# Patient Record
Sex: Male | Born: 1965 | Race: White | Hispanic: No | Smoking: Never smoker
Health system: Southern US, Community
[De-identification: ages and names within clinical notes are randomized; demographics above are authoritative.]

## PROBLEM LIST (undated history)

## (undated) DIAGNOSIS — I219 Acute myocardial infarction, unspecified: Secondary | ICD-10-CM

## (undated) HISTORY — PX: WRIST SURGERY: SHX841

---

## 1998-04-07 ENCOUNTER — Emergency Department (HOSPITAL_COMMUNITY): Admission: EM | Admit: 1998-04-07 | Discharge: 1998-04-07 | Payer: Self-pay | Admitting: Emergency Medicine

## 1998-04-07 ENCOUNTER — Emergency Department (HOSPITAL_COMMUNITY): Admission: EM | Admit: 1998-04-07 | Discharge: 1998-04-07 | Payer: Self-pay

## 1998-04-07 ENCOUNTER — Encounter: Payer: Self-pay | Admitting: Emergency Medicine

## 2000-08-09 ENCOUNTER — Emergency Department (HOSPITAL_COMMUNITY): Admission: EM | Admit: 2000-08-09 | Discharge: 2000-08-09 | Payer: Self-pay | Admitting: Emergency Medicine

## 2000-08-09 ENCOUNTER — Encounter: Payer: Self-pay | Admitting: Internal Medicine

## 2000-08-28 ENCOUNTER — Encounter: Admission: RE | Admit: 2000-08-28 | Discharge: 2000-08-28 | Payer: Self-pay | Admitting: Orthopedic Surgery

## 2000-08-28 ENCOUNTER — Encounter: Payer: Self-pay | Admitting: Orthopedic Surgery

## 2001-01-12 ENCOUNTER — Emergency Department (HOSPITAL_COMMUNITY): Admission: AC | Admit: 2001-01-12 | Discharge: 2001-01-12 | Payer: Self-pay

## 2001-04-16 ENCOUNTER — Encounter: Payer: Self-pay | Admitting: Physical Medicine and Rehabilitation

## 2001-04-16 ENCOUNTER — Ambulatory Visit (HOSPITAL_COMMUNITY)
Admission: RE | Admit: 2001-04-16 | Discharge: 2001-04-16 | Payer: Self-pay | Admitting: Physical Medicine and Rehabilitation

## 2002-02-09 ENCOUNTER — Encounter: Payer: Self-pay | Admitting: *Deleted

## 2002-02-09 ENCOUNTER — Emergency Department (HOSPITAL_COMMUNITY): Admission: AC | Admit: 2002-02-09 | Discharge: 2002-02-09 | Payer: Self-pay

## 2002-05-22 ENCOUNTER — Emergency Department (HOSPITAL_COMMUNITY): Admission: EM | Admit: 2002-05-22 | Discharge: 2002-05-23 | Payer: Self-pay | Admitting: Emergency Medicine

## 2004-10-04 ENCOUNTER — Emergency Department (HOSPITAL_COMMUNITY): Admission: EM | Admit: 2004-10-04 | Discharge: 2004-10-04 | Payer: Self-pay | Admitting: Emergency Medicine

## 2005-08-07 ENCOUNTER — Emergency Department (HOSPITAL_COMMUNITY): Admission: EM | Admit: 2005-08-07 | Discharge: 2005-08-08 | Payer: Self-pay | Admitting: Emergency Medicine

## 2006-06-18 ENCOUNTER — Inpatient Hospital Stay (HOSPITAL_COMMUNITY): Admission: EM | Admit: 2006-06-18 | Discharge: 2006-06-23 | Payer: Self-pay | Admitting: Emergency Medicine

## 2006-06-25 ENCOUNTER — Emergency Department (HOSPITAL_COMMUNITY): Admission: EM | Admit: 2006-06-25 | Discharge: 2006-06-25 | Payer: Self-pay | Admitting: Emergency Medicine

## 2017-03-27 ENCOUNTER — Emergency Department (HOSPITAL_COMMUNITY): Payer: Self-pay

## 2017-03-27 ENCOUNTER — Other Ambulatory Visit: Payer: Self-pay

## 2017-03-27 ENCOUNTER — Encounter (HOSPITAL_COMMUNITY): Payer: Self-pay

## 2017-03-27 ENCOUNTER — Emergency Department (HOSPITAL_COMMUNITY)
Admission: EM | Admit: 2017-03-27 | Discharge: 2017-03-27 | Disposition: A | Discharge status: Home / Self Care | Payer: Self-pay | Attending: Emergency Medicine | Admitting: Emergency Medicine

## 2017-03-27 DIAGNOSIS — I252 Old myocardial infarction: Secondary | ICD-10-CM | POA: Insufficient documentation

## 2017-03-27 DIAGNOSIS — X509XXA Other and unspecified overexertion or strenuous movements or postures, initial encounter: Secondary | ICD-10-CM | POA: Insufficient documentation

## 2017-03-27 DIAGNOSIS — Z87891 Personal history of nicotine dependence: Secondary | ICD-10-CM | POA: Insufficient documentation

## 2017-03-27 DIAGNOSIS — S46102A Unspecified injury of muscle, fascia and tendon of long head of biceps, left arm, initial encounter: Secondary | ICD-10-CM | POA: Insufficient documentation

## 2017-03-27 DIAGNOSIS — Y9389 Activity, other specified: Secondary | ICD-10-CM | POA: Insufficient documentation

## 2017-03-27 DIAGNOSIS — Y9259 Other trade areas as the place of occurrence of the external cause: Secondary | ICD-10-CM | POA: Insufficient documentation

## 2017-03-27 DIAGNOSIS — Y990 Civilian activity done for income or pay: Secondary | ICD-10-CM | POA: Insufficient documentation

## 2017-03-27 HISTORY — DX: Acute myocardial infarction, unspecified: I21.9

## 2017-03-27 MED ORDER — NAPROXEN 500 MG PO TABS: 500.0000 mg | ORAL_TABLET | Freq: Two times a day (BID) | ORAL | 0 refills | Status: DC

## 2017-03-27 MED ORDER — OXYCODONE-ACETAMINOPHEN 5-325 MG PO TABS
1.0000 | ORAL_TABLET | Freq: Once | ORAL | Status: DC
  Filled 2017-03-27: qty 1

## 2017-03-27 NOTE — Discharge Instructions (Signed)
Call [facility] for a follow up appointment.  °

## 2017-03-27 NOTE — ED Notes (Signed)
Pt refused d/c vitals and states that "I dont want medication" when given his d/c paperwork.

## 2017-03-27 NOTE — ED Notes (Signed)
Pt allowed PA to place sling on arm.

## 2017-03-27 NOTE — ED Notes (Signed)
Pt yelling in the hallway because he does not want the ortho tech to bend his arm. He is cursing and Engineer, sitebelittling staff.

## 2017-03-27 NOTE — ED Provider Notes (Signed)
[facility]-EMERGENCY DEPT Provider Note   CSN: [id number] Arrival date & time: 03/27/17  1759     History   Chief Complaint Chief Complaint  Patient presents with  . Arm Pain    HPI Trevor Stephens is a 51 y.o. male who presents to the ED with arm pain. 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 Then earlier today he started to catch a transmission that was going to fall on his foot. Patient reports that it felt like a muscle ripped in his arm. The pain is in the left shoulder and left bicep muscle.   HPI  Past Medical History:  Diagnosis Date  . MI (myocardial infarction) (HCC)     There are no active problems to display for this patient.   Past Surgical History:  Procedure Laterality Date  . WRIST SURGERY         Home Medications    Prior to Admission medications   Medication Sig Start Date End Date Taking? Authorizing Provider  naproxen (NAPROSYN) 500 MG tablet Take 1 tablet (500 mg total) by mouth 2 (two) times daily. 03/27/17   Janne NapoleonNeese,  M, NP    Family History Family History  Problem Relation Age of Onset  . Cancer Mother   . Heart failure Father   . Hypertension Father     Social History Social History   Tobacco Use  . Smoking status: Never Smoker  . Smokeless tobacco: Former Engineer, waterUser  Substance Use Topics  . Alcohol use: No    Frequency: Never  . Drug use: No     Allergies   Fish allergy and Penicillins   Review of Systems Review of Systems  Musculoskeletal: Positive for arthralgias.       Left arm  All other systems reviewed and are negative.    Physical Exam Updated Vital Signs BP (!) 142/95 (BP Location: Left Arm)   Pulse 84   Temp 98.4 F (36.9 C) (Oral)   Resp 16   Ht 6\' 2"  (1.88 m)   Wt 81.6 kg (180 lb)   SpO2 93%   BMI 23.11 kg/m   Physical Exam  Constitutional: He appears well-developed and well-nourished. No distress.    HENT:  Head: Normocephalic.  Eyes: EOM are normal.  Neck: Neck supple.  Cardiovascular: Normal rate.  Pulmonary/Chest: Effort normal.  Musculoskeletal:       Left upper arm: He exhibits tenderness and swelling.  Left biceps with tenderness on palpation and possible bicep disruption. Radial pulse 2+, adequate circulation. Left shoulder with tenderness on palpation and with attempted range of motion.   Neurological: He is alert.  Skin: Skin is warm and dry.  Nursing note and vitals reviewed.    ED Treatments / Results  Labs (all labs ordered are listed, but only abnormal results are displayed) Labs Reviewed - No data to display  Radiology Dg Shoulder Left  Result Date: 03/27/2017 CLINICAL DATA:  Shoulder pain after catching a falling piece of metal. EXAM: LEFT SHOULDER - 2+ VIEW COMPARISON:  None. FINDINGS: There is no evidence of fracture or dislocation. Joint space narrowing and minimal spurring about the [facility] joint consistent with osteoarthritis. Soft tissues are unremarkable. The adjacent ribs and lung are nonacute. IMPRESSION: Minimal AC joint osteoarthritis.  No acute osseous abnormality. Electronically Signed   By: Tollie Ethavid  Kwon M.D.   On: 03/27/2017 18:58   Dg Humerus Left  Result Date: 03/27/2017  CLINICAL DATA:  Muscle pain of the left arm after catching a falling piece of metal. EXAM: LEFT HUMERUS - 2+ VIEW COMPARISON:  None. FINDINGS: There is no evidence of fracture or other focal bone lesions. AC joint osteoarthritis with joint space narrowing and minimal spurring is noted. Intact glenohumeral joint. Soft tissues are unremarkable. IMPRESSION: AC joint osteoarthritis. No acute osseous abnormality of the left humerus. Electronically Signed   By: Tollie Ethavid  Kwon M.D.   On: 03/27/2017 18:57    Procedures Procedures (including critical care time)  Medications Ordered in ED Medications  oxyCODONE-acetaminophen (PERCOCET/ROXICET) 5-325 MG per tablet 1 tablet (1 tablet Oral Refused  03/27/17 2112)     Initial Impression / Assessment and Plan / ED Course  I have reviewed the triage vital signs and the nursing notes. 51 y.o. male with possible biceps disruption stable for [facility]. Arm sling. Offered pain medication and patient declined. Return precautions discussed.   Final Clinical Impressions(s) / ED Diagnoses   Final diagnoses:  Injury of tendon of long head of left biceps, initial encounter    ED Discharge Orders        Ordered    naproxen (NAPROSYN) 500 MG tablet  2 times daily     03/27/17 2132       Kerrie Buffaloeese,  [place], [place] 03/27/17 2233    Arby BarrettePfeiffer, Marcy, MD 03/31/17 1751

## 2017-03-27 NOTE — ED Triage Notes (Signed)
Patient  States he went to catch a heavy piece of metal that was going to fall on his foot and he felt "like the muscle pulled" in his left arm.

## 2017-03-27 NOTE — ED Notes (Signed)
Pt refusing sling. He states "No one is bending my arm."

## 2017-10-13 ENCOUNTER — Emergency Department (HOSPITAL_COMMUNITY): Payer: Self-pay

## 2017-10-13 ENCOUNTER — Encounter (HOSPITAL_COMMUNITY): Payer: Self-pay

## 2017-10-13 ENCOUNTER — Other Ambulatory Visit: Payer: Self-pay

## 2017-10-13 ENCOUNTER — Emergency Department (HOSPITAL_COMMUNITY)
Admission: EM | Admit: 2017-10-13 | Discharge: 2017-10-13 | Disposition: A | Discharge status: Home / Self Care | Payer: Self-pay | Attending: Emergency Medicine | Admitting: Emergency Medicine

## 2017-10-13 DIAGNOSIS — W57XXXA Bitten or stung by nonvenomous insect and other nonvenomous arthropods, initial encounter: Secondary | ICD-10-CM | POA: Insufficient documentation

## 2017-10-13 DIAGNOSIS — Y939 Activity, unspecified: Secondary | ICD-10-CM | POA: Insufficient documentation

## 2017-10-13 DIAGNOSIS — S40861A Insect bite (nonvenomous) of right upper arm, initial encounter: Secondary | ICD-10-CM | POA: Insufficient documentation

## 2017-10-13 DIAGNOSIS — M79641 Pain in right hand: Secondary | ICD-10-CM | POA: Insufficient documentation

## 2017-10-13 DIAGNOSIS — Y999 Unspecified external cause status: Secondary | ICD-10-CM | POA: Insufficient documentation

## 2017-10-13 DIAGNOSIS — S60221A Contusion of right hand, initial encounter: Secondary | ICD-10-CM | POA: Insufficient documentation

## 2017-10-13 DIAGNOSIS — G8921 Chronic pain due to trauma: Secondary | ICD-10-CM | POA: Insufficient documentation

## 2017-10-13 DIAGNOSIS — W231XXA Caught, crushed, jammed, or pinched between stationary objects, initial encounter: Secondary | ICD-10-CM | POA: Insufficient documentation

## 2017-10-13 DIAGNOSIS — Y929 Unspecified place or not applicable: Secondary | ICD-10-CM | POA: Insufficient documentation

## 2017-10-13 DIAGNOSIS — M25531 Pain in right wrist: Secondary | ICD-10-CM

## 2017-10-13 MED ORDER — SULFAMETHOXAZOLE-TRIMETHOPRIM 800-160 MG PO TABS: 1.0000 | ORAL_TABLET | Freq: Two times a day (BID) | ORAL | 0 refills | Status: AC

## 2017-10-13 MED ORDER — NAPROXEN 500 MG PO TABS: 500.0000 mg | ORAL_TABLET | Freq: Two times a day (BID) | ORAL | 0 refills | Status: AC

## 2017-10-13 NOTE — ED Triage Notes (Addendum)
Patient reports that he has an insect bite on the right upper arm near the elbow x 2  Days ago.  Patient states he got his right hand smashed between 2 concrete/metal forms approx 3 weeks ago. patient states that his right hand is stiff and at times has pins and needles feeling in it.

## 2017-10-13 NOTE — ED Provider Notes (Signed)
[facility]-EMERGENCY DEPT Provider Note   CSN: [id number] Arrival date & time: 10/13/17  1659     History   Chief Complaint Chief Complaint  Patient presents with  . Insect Bite  . hand issues    HPI Trevor Stephens is a 52 y.o. male who presents to the ED with c/o insect bite to the right upper arm that occurred 2 days ago. Patient also reports that he got his hand smashed between concrete and metal 3 weeks ago and continues to have pain. Patient also reports hx of repetitive use of hand on his job and that his hands and wrist have been hurting for a good while now.    HPI  Past Medical History:  Diagnosis Date  . MI (myocardial infarction) (HCC)     There are no active problems to display for this patient.   Past Surgical History:  Procedure Laterality Date  . WRIST SURGERY          Home Medications    Prior to Admission medications   Medication Sig Start Date End Date Taking? Authorizing Provider  naproxen (NAPROSYN) 500 MG tablet Take 1 tablet (500 mg total) by mouth 2 (two) times daily. 10/13/17   Janne NapoleonNeese, Hope M, NP  sulfamethoxazole-trimethoprim (BACTRIM DS,SEPTRA DS) 800-160 MG tablet Take 1 tablet by mouth 2 (two) times daily for 7 days. 10/13/17 10/20/17  Janne NapoleonNeese, Hope M, NP    Family History Family History  Problem Relation Age of Onset  . Cancer Mother   . Heart failure Father   . Hypertension Father     Social History Social History   Tobacco Use  . Smoking status: Never Smoker  . Smokeless tobacco: Former Engineer, waterUser  Substance Use Topics  . Alcohol use: No    Frequency: Never  . Drug use: No     Allergies   Fish allergy and Penicillins   Review of Systems Review of Systems  Musculoskeletal: Positive for arthralgias.  Skin: Positive for wound.  All other systems reviewed and are negative.    Physical Exam Updated Vital Signs BP 115/86 (BP Location: Left Arm)   Pulse (!) 50   Temp 97.8 F (36.6 C) (Oral)   Resp  16   Ht 6\' 2"  (1.88 m)   Wt 74.8 kg (165 lb)   SpO2 100%   BMI 21.18 kg/m   Physical Exam  Constitutional: He appears well-developed and well-nourished. No distress.  HENT:  Head: Normocephalic and atraumatic.  Eyes: EOM are normal.  Neck: Normal range of motion. Neck supple.  Cardiovascular: Bradycardia present.  Pulmonary/Chest: Effort normal.  Musculoskeletal: Normal range of motion.       Right elbow: He exhibits swelling. Tenderness found.       Right wrist: He exhibits tenderness. He exhibits normal range of motion, no deformity and no laceration.  Positive Tinel's sign right wrist. Radial pulse 2+, equal grips. Right upper arm just above the elbow with 1 cm area of erythema, tender on exam with pustular center. C/w infected insect bite.   Neurological: He is alert.  Skin: Skin is warm and dry.  Psychiatric: He has a normal mood and affect.  Nursing note and vitals reviewed.    ED Treatments / Results  Labs (all labs ordered are listed, but only abnormal results are displayed) Labs Reviewed - No data to display  Radiology Dg Hand Complete Right  Result Date: 10/13/2017 CLINICAL DATA:  Smash injury.  Three weeks ago.  Pain. EXAM:  RIGHT HAND - COMPLETE 3+ VIEW COMPARISON:  None. FINDINGS: There is no evidence of fracture or dislocation. There is no evidence of arthropathy or other focal bone abnormality. Soft tissues are unremarkable. IMPRESSION: Negative. Electronically Signed   By: Elsie Stain M.D.   On: 10/13/2017 18:59    Procedures Procedures (including critical care time)  Medications Ordered in ED Medications - No data to display   Initial Impression / Assessment and Plan / ED Course  I have reviewed the triage vital signs and the nursing notes. 52 y.o. male with wrist pain and infected insect bite stable for d/c without fever and does not appear toxic. Wrist splint to right wrist and referral to hand for further evaluation. Wound treated with antibiotics.  Return precautions discussed.   Final Clinical Impressions(s) / ED Diagnoses   Final diagnoses:  Bug bite with infection, initial encounter  Right wrist pain  Contusion of right hand, initial encounter    ED Discharge Orders        Ordered    sulfamethoxazole-trimethoprim (BACTRIM DS,SEPTRA DS) 800-160 MG tablet  2 times daily     10/13/17 1918    naproxen (NAPROSYN) 500 MG tablet  2 times daily     10/13/17 1918       Kerrie Buffalo [place], [place] 10/13/17 2158    Benjiman Core, MD 10/14/17 [phone]

## 2017-10-13 NOTE — Discharge Instructions (Addendum)
Wear the wrist splint for support and comfort. Call Dr. Carlos LeveringGramig's office for a follow up appointment for your wrist and hand pain. Return here as needed.

## 2017-10-13 NOTE — ED Notes (Signed)
Ortho at bedside.

## 2017-10-16 ENCOUNTER — Emergency Department (HOSPITAL_COMMUNITY)
Admission: EM | Admit: 2017-10-16 | Discharge: 2017-10-17 | Disposition: A | Discharge status: Home / Self Care | Payer: Self-pay | Attending: Emergency Medicine | Admitting: Emergency Medicine

## 2017-10-16 ENCOUNTER — Encounter (HOSPITAL_COMMUNITY): Payer: Self-pay

## 2017-10-16 ENCOUNTER — Other Ambulatory Visit: Payer: Self-pay

## 2017-10-16 DIAGNOSIS — Z79899 Other long term (current) drug therapy: Secondary | ICD-10-CM | POA: Insufficient documentation

## 2017-10-16 DIAGNOSIS — I252 Old myocardial infarction: Secondary | ICD-10-CM | POA: Insufficient documentation

## 2017-10-16 DIAGNOSIS — Y929 Unspecified place or not applicable: Secondary | ICD-10-CM | POA: Insufficient documentation

## 2017-10-16 DIAGNOSIS — Y999 Unspecified external cause status: Secondary | ICD-10-CM | POA: Insufficient documentation

## 2017-10-16 DIAGNOSIS — Z87891 Personal history of nicotine dependence: Secondary | ICD-10-CM | POA: Insufficient documentation

## 2017-10-16 DIAGNOSIS — Y939 Activity, unspecified: Secondary | ICD-10-CM | POA: Insufficient documentation

## 2017-10-16 DIAGNOSIS — S40861A Insect bite (nonvenomous) of right upper arm, initial encounter: Secondary | ICD-10-CM | POA: Insufficient documentation

## 2017-10-16 DIAGNOSIS — W57XXXA Bitten or stung by nonvenomous insect and other nonvenomous arthropods, initial encounter: Secondary | ICD-10-CM | POA: Insufficient documentation

## 2017-10-16 LAB — CBC WITH DIFFERENTIAL/PLATELET
Basophils Absolute: 0 10*3/uL (ref 0.0–0.1)
Basophils Relative: 0 %
Eosinophils Absolute: 0.1 10*3/uL (ref 0.0–0.7)
Eosinophils Relative: 1 %
HCT: 39.6 % (ref 39.0–52.0)
Hemoglobin: 13.2 g/dL (ref 13.0–17.0)
Lymphocytes Relative: 16 %
Lymphs Abs: 1.9 10*3/uL (ref 0.7–4.0)
MCH: 30.1 pg (ref 26.0–34.0)
MCHC: 33.3 g/dL (ref 30.0–36.0)
MCV: 90.4 fL (ref 78.0–100.0)
Monocytes Absolute: 0.9 10*3/uL (ref 0.1–1.0)
Monocytes Relative: 8 %
Neutro Abs: 8.4 10*3/uL — ABNORMAL HIGH (ref 1.7–7.7)
Neutrophils Relative %: 75 %
Platelets: 309 10*3/uL (ref 150–400)
RBC: 4.38 MIL/uL (ref 4.22–5.81)
RDW: 13.2 % (ref 11.5–15.5)
WBC: 11.3 10*3/uL — ABNORMAL HIGH (ref 4.0–10.5)

## 2017-10-16 LAB — COMPREHENSIVE METABOLIC PANEL
ALK PHOS: 92 U/L (ref 38–126)
ALT: 14 U/L (ref 0–44)
AST: 18 U/L (ref 15–41)
Albumin: 3.7 g/dL (ref 3.5–5.0)
Anion gap: 7 (ref 5–15)
BUN: 19 mg/dL (ref 6–20)
CALCIUM: 9.1 mg/dL (ref 8.9–10.3)
CO2: 26 mmol/L (ref 22–32)
CREATININE: 1.15 mg/dL (ref 0.61–1.24)
Chloride: 105 mmol/L (ref 98–111)
Glucose, Bld: 123 mg/dL — ABNORMAL HIGH (ref 70–99)
Potassium: 3.7 mmol/L (ref 3.5–5.1)
Sodium: 138 mmol/L (ref 135–145)
Total Bilirubin: 0.5 mg/dL (ref 0.3–1.2)
Total Protein: 6.8 g/dL (ref 6.5–8.1)

## 2017-10-16 LAB — I-STAT CG4 LACTIC ACID, ED: Lactic Acid, Venous: 1.47 mmol/L (ref 0.5–1.9)

## 2017-10-16 MED ORDER — CLINDAMYCIN PHOSPHATE 600 MG/50ML IV SOLN
600.0000 mg | Freq: Once | INTRAVENOUS | Status: AC
  Administered 2017-10-16: 600 mg via INTRAVENOUS
  Filled 2017-10-16: qty 50

## 2017-10-16 MED ORDER — OXYCODONE-ACETAMINOPHEN 5-325 MG PO TABS
1.0000 | ORAL_TABLET | Freq: Once | ORAL | Status: AC
  Administered 2017-10-16: 1 via ORAL
  Filled 2017-10-16: qty 1

## 2017-10-16 NOTE — ED Notes (Signed)
Patient educated about not driving or performing other critical tasks (such as operating heavy machinery, caring for infant/toddler/child) due to sedative nature of narcotic medications received while in the ED.  Pt/caregiver verbalized understanding.   

## 2017-10-16 NOTE — ED Notes (Signed)
ED Provider at bedside. 

## 2017-10-16 NOTE — ED Triage Notes (Signed)
Pt has a spider bite from about three days ago and was seen and treated with oral antibiotics Pt states it's getting worse and he can't bend his elbow and his arm is red and swollen

## 2017-10-16 NOTE — ED Provider Notes (Signed)
[facility]-EMERGENCY DEPT Provider Note   CSN: [id number] Arrival date & time: 10/16/17  2011     History   Chief Complaint Chief Complaint  Patient presents with  . Insect Bite    HPI Trevor Stephens is a 52 y.o. male with a past medical history of prior MI, who presents to ED for evaluation of insect bite to the right upper arm that occurred 5 days ago.  States that he was outside in the dark in a porta potty when he got bit by an unknown insect on the arm.  He was seen and evaluated here in the ED 3 days ago and was given a prescription for Bactrim.  He states that he was unable to get the prescription filled due to not being able to afford it.  He has not been taking any medicine to help with the area.  States that there was some drainage from the area earlier.  Denies any fever, additional trauma to the area.  HPI  Past Medical History:  Diagnosis Date  . MI (myocardial infarction) (HCC)     There are no active problems to display for this patient.   Past Surgical History:  Procedure Laterality Date  . WRIST SURGERY          Home Medications    Prior to Admission medications   Medication Sig Start Date End Date Taking? Authorizing Provider  clindamycin (CLEOCIN) 150 MG capsule Take 3 capsules (450 mg total) by mouth 3 (three) times daily for 5 days. 10/17/17 10/22/17  Lathyn Griggs, PA-C  naproxen (NAPROSYN) 500 MG tablet Take 1 tablet (500 mg total) by mouth 2 (two) times daily. Patient not taking: Reported on 10/16/2017 10/13/17   Janne Napoleon, NP  sulfamethoxazole-trimethoprim (BACTRIM DS,SEPTRA DS) 800-160 MG tablet Take 1 tablet by mouth 2 (two) times daily for 7 days. Patient not taking: Reported on 10/16/2017 10/13/17 10/20/17  Janne Napoleon, NP  traMADol (ULTRAM) 50 MG tablet Take 1 tablet (50 mg total) by mouth every 6 (six) hours as needed. 10/17/17   Dietrich Pates, PA-C    Family History Family History  Problem Relation Age of Onset  .  Cancer Mother   . Heart failure Father   . Hypertension Father     Social History Social History   Tobacco Use  . Smoking status: Never Smoker  . Smokeless tobacco: Former Engineer, water Use Topics  . Alcohol use: No    Frequency: Never  . Drug use: No     Allergies   Penicillins and Fish allergy   Review of Systems Review of Systems  Constitutional: Negative for appetite change, chills and fever.  HENT: Negative for ear pain, rhinorrhea, sneezing and sore throat.   Eyes: Negative for photophobia and visual disturbance.  Respiratory: Negative for cough, chest tightness, shortness of breath and wheezing.   Cardiovascular: Negative for chest pain and palpitations.  Gastrointestinal: Negative for abdominal pain, blood in stool, constipation, diarrhea, nausea and vomiting.  Genitourinary: Negative for dysuria, hematuria and urgency.  Musculoskeletal: Negative for myalgias.  Skin: Positive for wound. Negative for rash.  Neurological: Negative for dizziness, weakness and light-headedness.     Physical Exam Updated Vital Signs BP 105/73   Pulse (!) 52   Temp 98.5 F (36.9 C) (Oral)   Resp 17   Ht 6\' 2"  (1.88 m)   Wt 77.1 kg (170 lb)   SpO2 97%   BMI 21.83 kg/m   Physical Exam  Constitutional: He appears well-developed and well-nourished. No distress.  HENT:  Head: Normocephalic and atraumatic.  Nose: Nose normal.  Eyes: Conjunctivae and EOM are normal. Left eye exhibits no discharge. No scleral icterus.  Neck: Normal range of motion. Neck supple.  Cardiovascular: Normal rate, regular rhythm, normal heart sounds and intact distal pulses. Exam reveals no gallop and no friction rub.  No murmur heard. Pulmonary/Chest: Effort normal and breath sounds normal. No respiratory distress.  Abdominal: Soft. Bowel sounds are normal. He exhibits no distension. There is no tenderness. There is no guarding.  Musculoskeletal: Normal range of motion. He exhibits no edema.    Neurological: He is alert. He exhibits normal muscle tone. Coordination normal.  Skin: Skin is warm and dry. No rash noted. There is erythema.  Erythema of the right upper arm as noted in the image.  No fluctuance noted.  No overlying warmth noted.  Area is neurovascularly intact.  Psychiatric: He has a normal mood and affect.  Nursing note and vitals reviewed.      ED Treatments / Results  Labs (all labs ordered are listed, but only abnormal results are displayed) Labs Reviewed  COMPREHENSIVE METABOLIC PANEL - Abnormal; Notable for the following components:      Result Value   Glucose, Bld 123 (*)    All other components within normal limits  CBC WITH DIFFERENTIAL/PLATELET - Abnormal; Notable for the following components:   WBC 11.3 (*)    Neutro Abs 8.4 (*)    All other components within normal limits  I-STAT CG4 LACTIC ACID, ED    EKG None  Radiology No results found.   EMERGENCY DEPARTMENT US SOFT TISSUE INTERPRETATION "Study: Limited Soft Tissue Ultrasound"  INDICATIONS: Pain Multiple views of the body part were obtained in real-time with a multi-frequency linear probe  PERFORMED BY: Myself IMAGES ARCHIVED?: No SIDE:Right  BODY PART:Upper extremity INTERPRETATION:  No abcess noted and Cellulitis present     Procedures Procedures (including critical care time)  Medications Ordered in ED Medications  oxyCODONE-acetaminophen (PERCOCET/ROXICET) 5-325 MG per tablet 1 tablet (1 tablet Oral Given 10/16/17 2242)  clindamycin (CLEOCIN) IVPB 600 mg (0 mg Intravenous Stopped 10/17/17 0016)  morphine 2 MG/ML injection 2 mg (2 mg Intravenous Given 10/17/17 0129)     Initial Impression / Assessment and Plan / ED Course  I have reviewed the triage vital signs and the nursing notes.  Pertinent labs & imaging results that were available during my care of the patient were reviewed by me and considered in my medical decision making (see chart for details).      52 year old male presents to ED for evaluation of insect bite to right upper extremity that occurred 5 days ago.  Seen and evaluated here 2 days ago and antibiotics were prescribed.  He did not get this filled due to price.  States that the area is getting more painful and swollen.  Physical exam findings noted in the image.  No overlying warmth noted.  Ultrasound revealed no abscess or fluid collection.  No fluctuance palpated.  Patient unable to tolerate potential incision and drainage even with pain medication given.  Will give dose of IV clindamycin here and clindamycin p.o. for discharge.  At this point he has not tried any antibiotics so I do not feel that he needs admission for IV antibiotics at this time.  Lab work shows mild leukocytosis at 11.  He is afebrile.  Lactic acid normal.  I urged the patient the importance of taking  his antibiotics as prescribed.  Will give short course of pain medication as well.  Advised to return to ED for any severe worsening symptoms. [place] PMP reviewed with no discrepancies.  Portions of this note were generated with Scientist, clinical (histocompatibility and immunogenetics)Dragon dictation software. Dictation errors may occur despite best attempts at proofreading.   Final Clinical Impressions(s) / ED Diagnoses   Final diagnoses:  Insect bite of right upper arm, initial encounter    ED Discharge Orders        Ordered    clindamycin (CLEOCIN) 150 MG capsule  3 times daily     10/17/17 0149    traMADol (ULTRAM) 50 MG tablet  Every 6 hours PRN     10/17/17 0149       Dietrich PatesKhatri, [place], PA-C 10/17/17 0149    Bethann BerkshireZammit, Joseph, MD 10/18/17 [phone]

## 2017-10-17 MED ORDER — TRAMADOL HCL 50 MG PO TABS: 50.0000 mg | ORAL_TABLET | Freq: Four times a day (QID) | ORAL | 0 refills | Status: AC | PRN

## 2017-10-17 MED ORDER — CLINDAMYCIN HCL 150 MG PO CAPS: 450.0000 mg | ORAL_CAPSULE | Freq: Three times a day (TID) | ORAL | 0 refills | Status: AC

## 2017-10-17 MED ORDER — MORPHINE SULFATE (PF) 2 MG/ML IV SOLN
2.0000 mg | Freq: Once | INTRAVENOUS | Status: AC
  Administered 2017-10-17: 2 mg via INTRAVENOUS
  Filled 2017-10-17: qty 1

## 2017-10-17 NOTE — ED Notes (Signed)
Patient educated about not driving or performing other critical tasks (such as operating heavy machinery, caring for infant/toddler/child) due to sedative nature of narcotic medications received while in the ED.  Pt/caregiver verbalized understanding.   

## 2017-10-17 NOTE — ED Notes (Signed)
ED Provider at bedside. 

## 2017-10-17 NOTE — Discharge Instructions (Addendum)
Return to ED for worsening symptoms, swelling of your joints, fever, increased drainage from area.

## 2017-12-16 ENCOUNTER — Other Ambulatory Visit: Payer: Self-pay

## 2017-12-16 ENCOUNTER — Emergency Department (HOSPITAL_COMMUNITY): Payer: BLUE CROSS/BLUE SHIELD

## 2017-12-16 ENCOUNTER — Emergency Department (HOSPITAL_COMMUNITY)
Admission: EM | Admit: 2017-12-16 | Discharge: 2017-12-16 | Disposition: A | Discharge status: Home / Self Care | Payer: BLUE CROSS/BLUE SHIELD | Attending: Emergency Medicine | Admitting: Emergency Medicine

## 2017-12-16 ENCOUNTER — Encounter (HOSPITAL_COMMUNITY): Payer: Self-pay | Admitting: *Deleted

## 2017-12-16 DIAGNOSIS — M549 Dorsalgia, unspecified: Secondary | ICD-10-CM

## 2017-12-16 DIAGNOSIS — S4991XA Unspecified injury of right shoulder and upper arm, initial encounter: Secondary | ICD-10-CM | POA: Insufficient documentation

## 2017-12-16 DIAGNOSIS — Y998 Other external cause status: Secondary | ICD-10-CM | POA: Diagnosis not present

## 2017-12-16 DIAGNOSIS — S59901A Unspecified injury of right elbow, initial encounter: Secondary | ICD-10-CM | POA: Insufficient documentation

## 2017-12-16 DIAGNOSIS — Y939 Activity, unspecified: Secondary | ICD-10-CM | POA: Diagnosis not present

## 2017-12-16 DIAGNOSIS — Y929 Unspecified place or not applicable: Secondary | ICD-10-CM | POA: Diagnosis not present

## 2017-12-16 DIAGNOSIS — M5489 Other dorsalgia: Secondary | ICD-10-CM | POA: Diagnosis not present

## 2017-12-16 DIAGNOSIS — I252 Old myocardial infarction: Secondary | ICD-10-CM | POA: Diagnosis not present

## 2017-12-16 DIAGNOSIS — W11XXXA Fall on and from ladder, initial encounter: Secondary | ICD-10-CM | POA: Insufficient documentation

## 2017-12-16 DIAGNOSIS — W19XXXA Unspecified fall, initial encounter: Secondary | ICD-10-CM

## 2017-12-16 NOTE — ED Triage Notes (Signed)
Pt complains of bilateral arm pain, upper back pain since falling off a ~10 ft ladder 3 days ago. Pt states he caught himself with his arms when he fell.

## 2017-12-16 NOTE — ED Notes (Signed)
Pt reports that he had fallen off of a ladder on Friday. Pt has multiple pain complaints. Pt states that he has bilateral arm pain and states that he has not been able to gain full ROM and is unable to lift arms up completely.

## 2017-12-16 NOTE — ED Provider Notes (Signed)
[facility]-EMERGENCY DEPT Provider Note   CSN: [id number] Arrival date & time: 12/16/17  1448     History   Chief Complaint Chief Complaint  Patient presents with  . Arm Pain  . Back Pain  . Fall    HPI Trevor Stephens is a 52 y.o. male.  HPI   Patient is a 52 year old male with a history of Stephens who presents emergency department today for evaluation after a fall that occurred several days ago.  Patient states that he was on a 10 foot ladder and was holding onto the rails, he attempted to turn around to climb down the ladder and his foot slipped.  States that he jerked his arms on the railings of the ladder injuring his bilateral arms, he then landed on his buttocks on the ground.  Denies any head trauma or LOC.  States that he has had neck pain, upper back pain and bilateral arm pain, worse on the right since the fall.  Pain in right upper extremity located to the right shoulder and the right elbow.  Associated with swelling.  States pain is severe in nature and constant.  Has been ambulatory since the fall.  Has not taken any medications for his symptoms.  Denies numbness to his arms or legs.  No bowel or urinary incontinence.  No saddle anesthesia.  Past Medical History:  Diagnosis Date  . Stephens (myocardial infarction) (HCC)     There are no active problems to display for this patient.   Past Surgical History:  Procedure Laterality Date  . WRIST SURGERY          Home Medications    Prior to Admission medications   Medication Sig Start Date End Date Taking? Authorizing Provider  naproxen (NAPROSYN) 500 MG tablet Take 1 tablet (500 mg total) by mouth 2 (two) times daily. Patient not taking: Reported on 10/16/2017 10/13/17   Janne NapoleonNeese, Hope M, NP  traMADol (ULTRAM) 50 MG tablet Take 1 tablet (50 mg total) by mouth every 6 (six) hours as needed. 10/17/17   Dietrich PatesKhatri, Hina, PA-C    Family History Family History  Problem Relation Age of Onset  . Cancer Mother    . Heart failure Father   . Hypertension Father     Social History Social History   Tobacco Use  . Smoking status: Never Smoker  . Smokeless tobacco: Former Engineer, [employer]  Substance Use Topics  . Alcohol use: No    Frequency: Never  . Drug use: No     Allergies   Penicillins and Fish allergy   Review of Systems Review of Systems  Constitutional: Negative for fever.  Respiratory: Negative for shortness of breath.   Cardiovascular: Negative for chest pain.  Gastrointestinal: Negative for abdominal pain.       No loss of control of bowel function  Genitourinary:       No loss of control of bladder function, no urinary retention  Musculoskeletal: Positive for back pain and neck pain.       BUE pain  Skin: Negative for color change.  Neurological: Negative for numbness and headaches.       No head trauma or loc    Physical Exam Updated Vital Signs BP 136/86   Pulse 78   Temp 98.4 F (36.9 C) (Oral)   Resp 18   SpO2 97%   Physical Exam  Constitutional: He appears well-developed and well-nourished.  HENT:  Head: Normocephalic and atraumatic.  Mouth/Throat: Oropharynx is clear  and moist.  Eyes: Conjunctivae are normal.  Neck: Neck supple.  Cardiovascular: Normal rate, regular rhythm and normal heart sounds.  No murmur heard. Pulmonary/Chest: Effort normal and breath sounds normal. No respiratory distress. He has no wheezes.  Abdominal: Soft. There is no tenderness.  Musculoskeletal:  TTP to lower cervical and upper thoracic spine. TTP to the lumbar spine. TTP to the anterior aspect of the right shoulder and along the right trapezius muscle with no deformity. No TTP along the humerus. TTP diffusely to the right elbow with overlying swelling. Decreased ROM to the right shoulder secondary to pain. Strength is grossly intact to BUE though mildly decreased on the right 2/2 pain. Negative empty can. Negative crossover. 5/5 strength to BLE, normal sensation to BUE & BLE.    Neurological: He is alert. No cranial nerve deficit.  Skin: Skin is warm and dry.  Psychiatric: He has a normal mood and affect.  Nursing note and vitals reviewed.  ED Treatments / Results  Labs (all labs ordered are listed, but only abnormal results are displayed) Labs Reviewed - No data to display  EKG None  Radiology Dg Thoracic Spine 2 View  Result Date: 12/16/2017 CLINICAL DATA:  Fall from ladder 2 days ago.  Back pain EXAM: THORACIC SPINE 2 VIEWS COMPARISON:  Chest x-ray 06/20/2006 FINDINGS: Mild rightward scoliosis in the midthoracic spine. No fracture or subluxation. IMPRESSION: No acute bony abnormality. Electronically Signed   By: Charlett Nose M.D.   On: 12/16/2017 16:42   Dg Lumbar Spine Complete  Result Date: 12/16/2017 CLINICAL DATA:  Fall from ladder 2 days ago.  Back pain. EXAM: LUMBAR SPINE - COMPLETE 4+ VIEW COMPARISON:  None. FINDINGS: Normal alignment. Transitional anatomy at the lumbosacral junction. No acute fracture. SI joints are symmetric and unremarkable. IMPRESSION: No acute bony abnormality. Electronically Signed   By: Charlett Nose M.D.   On: 12/16/2017 16:42   Dg Shoulder Right  Result Date: 12/16/2017 CLINICAL DATA:  Larey Seat from 10' ladder 2 days prior. Pt c/o back pain from shoulders to mid/low back. Right posterior shoulder and elbow pain. States that there was a lot of swelling mid humerus, but that has mostly resolved. EXAM: RIGHT SHOULDER - 2+ VIEW COMPARISON:  None. FINDINGS: No fracture.  No bone lesion. Glenohumeral joint normally spaced and aligned. Narrowing of the [facility] joint with marginal osteophytes consistent with mild osteoarthritis. Soft tissues are unremarkable. IMPRESSION: .: IMPRESSION: . 1. No fracture or dislocation.  No acute finding. 2. Mild AC joint osteoarthritis. Electronically Signed   By: Amie Portland M.D.   On: 12/16/2017 16:43   Dg Elbow Complete Right  Result Date: 12/16/2017 CLINICAL DATA:  Fall from ladder 2 days ago. EXAM: RIGHT  ELBOW - COMPLETE 3+ VIEW COMPARISON:  None. FINDINGS: Soft tissue swelling about the right elbow. No joint effusion. No fracture, subluxation or dislocation. IMPRESSION: No acute bony abnormality. Electronically Signed   By: Charlett Nose M.D.   On: 12/16/2017 16:43   Ct Cervical Spine Wo Contrast  Result Date: 12/16/2017 CLINICAL DATA:  Fall from 10 ft ladder.  Bilateral arm pain. EXAM: CT CERVICAL SPINE WITHOUT CONTRAST TECHNIQUE: Multidetector CT imaging of the cervical spine was performed without intravenous contrast. Multiplanar CT image reconstructions were also generated. COMPARISON:  CT 10/04/2004 FINDINGS: Alignment: No subluxation Skull base and vertebrae: No acute fracture. No primary bone lesion or focal pathologic process. Soft tissues and spinal canal: No prevertebral fluid or swelling. No visible canal hematoma. Disc levels: Diffuse degenerative  disc and facet disease throughout the cervical spine, most notable disc disease being at C4-5 through C6-7. Upper chest: No acute findings Other: No acute findings IMPRESSION: Diffuse degenerative disc and facet disease, most pronounced C4-5 through C6-7. No acute bony abnormality. Electronically Signed   By: Charlett Nose M.D.   On: 12/16/2017 16:21    Procedures Procedures (including critical care time)  Medications Ordered in ED Medications - No data to display   Initial Impression / Assessment and Plan / ED Course  I have reviewed the triage vital signs and the nursing notes.  Pertinent labs & imaging results that were available during my care of the patient were reviewed by me and considered in my medical decision making (see chart for details).   Final Clinical Impressions(s) / ED Diagnoses   Final diagnoses:  Fall, initial encounter  Injury of right shoulder, initial encounter  Elbow injury, right, initial encounter  Acute back pain, unspecified back location, unspecified back pain laterality   Patient presenting after falling  off of a ladder 3 days ago.  Complaining of pain to the right upper extremity shoulder and elbow.  Also complaining of neck pain and upper back pain.  Normal neurologic exam.  Imaging of the cervical spine negative for any acute bony abnormality.  X-rays of the thoracic and lumbar spine are negative for acute bony abnormality.  X-ray of the right shoulder and right elbow negative for acute bony abnormality.  Suspect symptoms due to muscle strain versus spasm.  He declines any medications in the ED and declines any prescriptions for medications at home.  Will give orthopedic follow-up given his right shoulder injury, as he may have rotator cuff injury.  Have advised him to return to the ER for any new or worsening symptoms in the meantime.  Pt voices understanding of the plan reasons to return to the ED.  All questions answered.  ED Discharge Orders    None       Rayne Du 12/16/17 1723    Charlynne Pander, MD 12/16/17 [phone]

## 2017-12-16 NOTE — Discharge Instructions (Addendum)
Please follow up with your primary care provider within 5-7 days for re-evaluation of your symptoms. If you do not have a primary care provider, information for a healthcare clinic has been provided for you to make arrangements for follow up care. Please return to the emergency department for any new or worsening symptoms. ° °

## 2018-12-05 IMAGING — CR DG ELBOW COMPLETE 3+V*R*
5 series · 5 of 5 positions shown · non-contrast
Comparison: None.

CLINICAL DATA: Fall from ladder 2 days ago.

EXAM:
RIGHT ELBOW - COMPLETE 3+ VIEW

[x elbow lat right (1 of 2)]
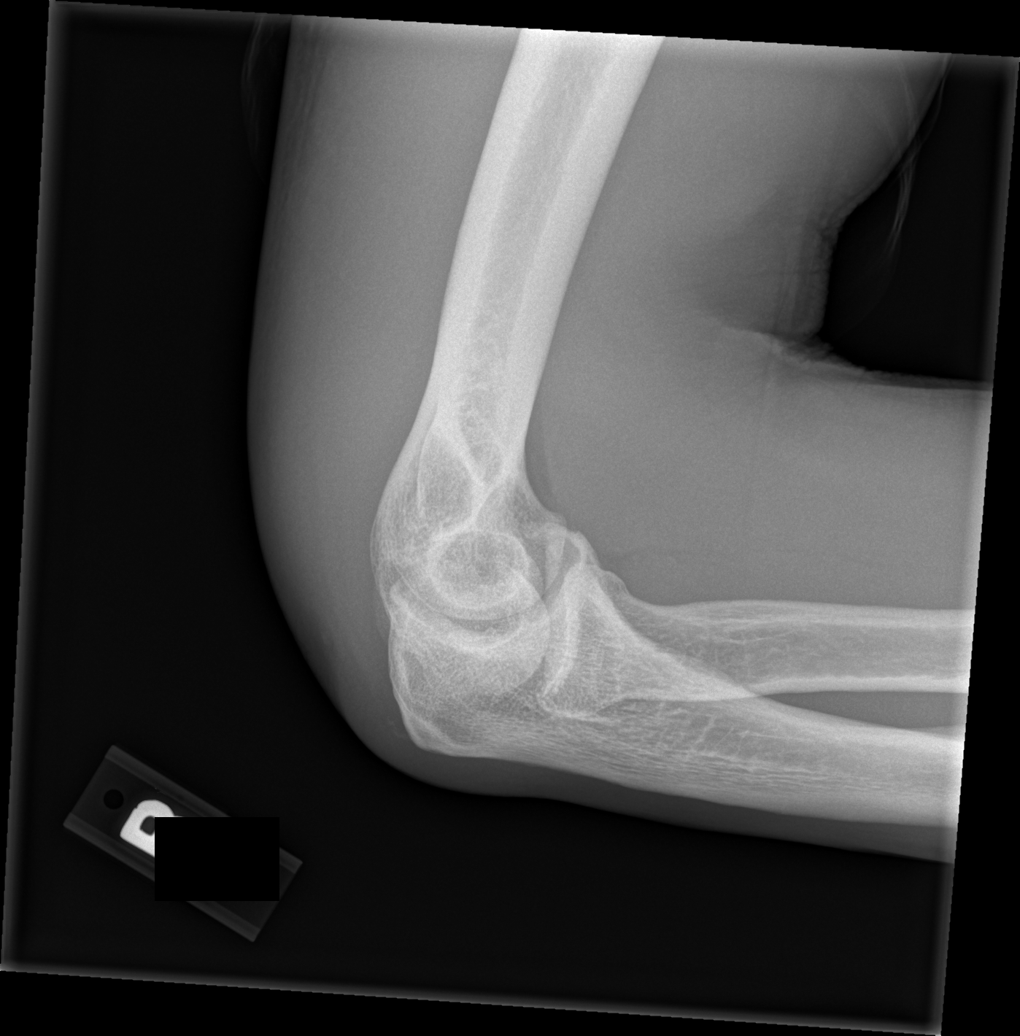

[x elbow lat right (2 of 2)]
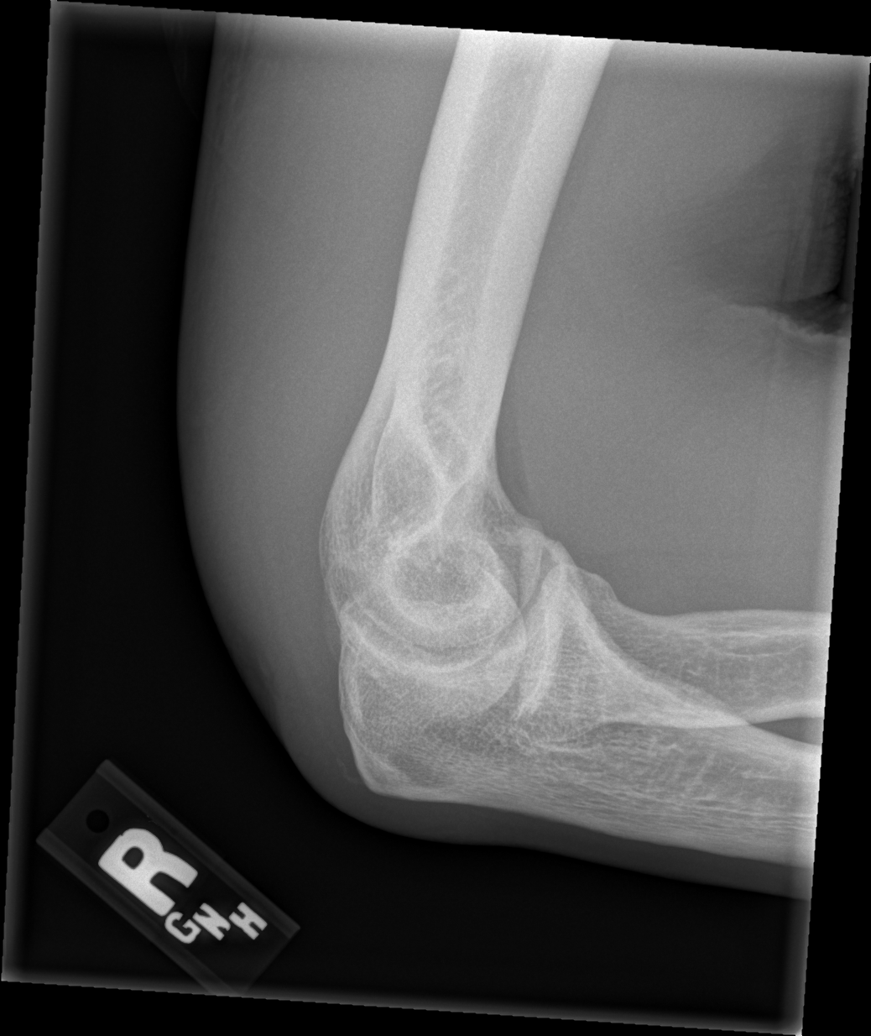

[x elbow ap right]
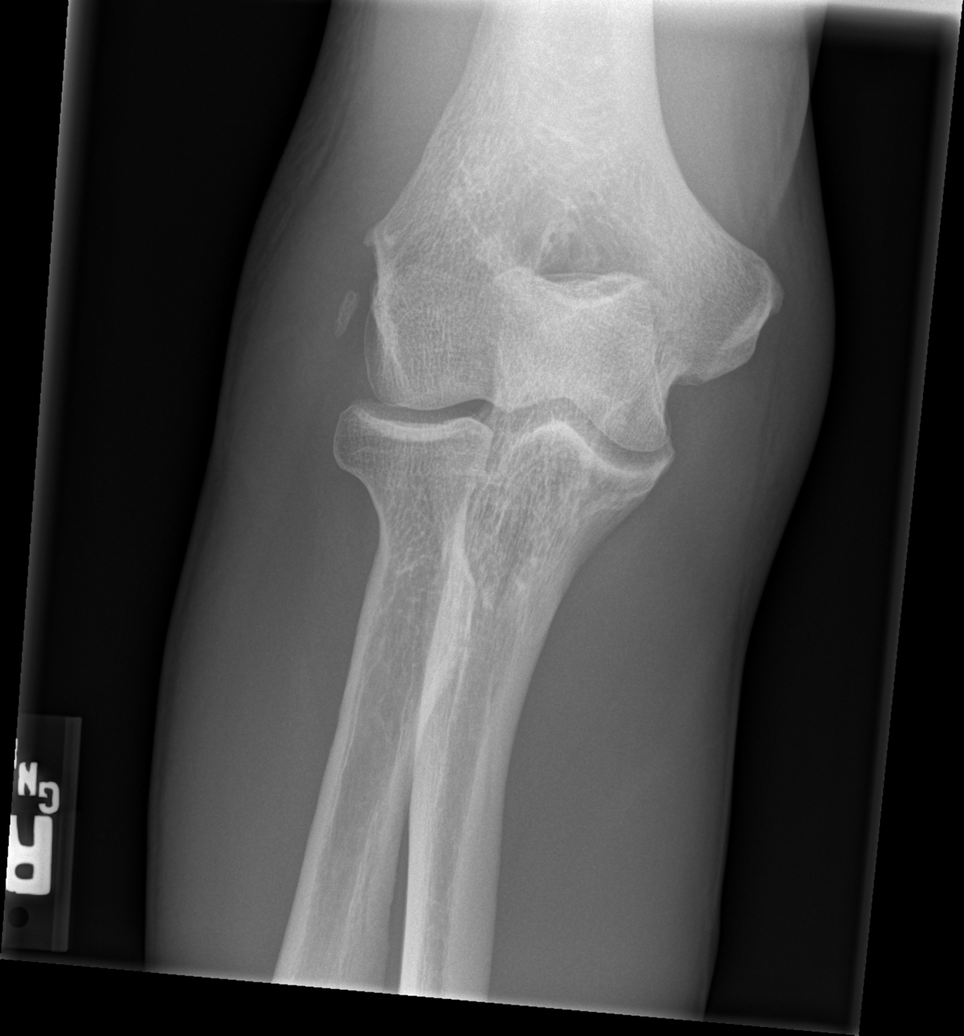

[x elbow obl right (1 of 2)]
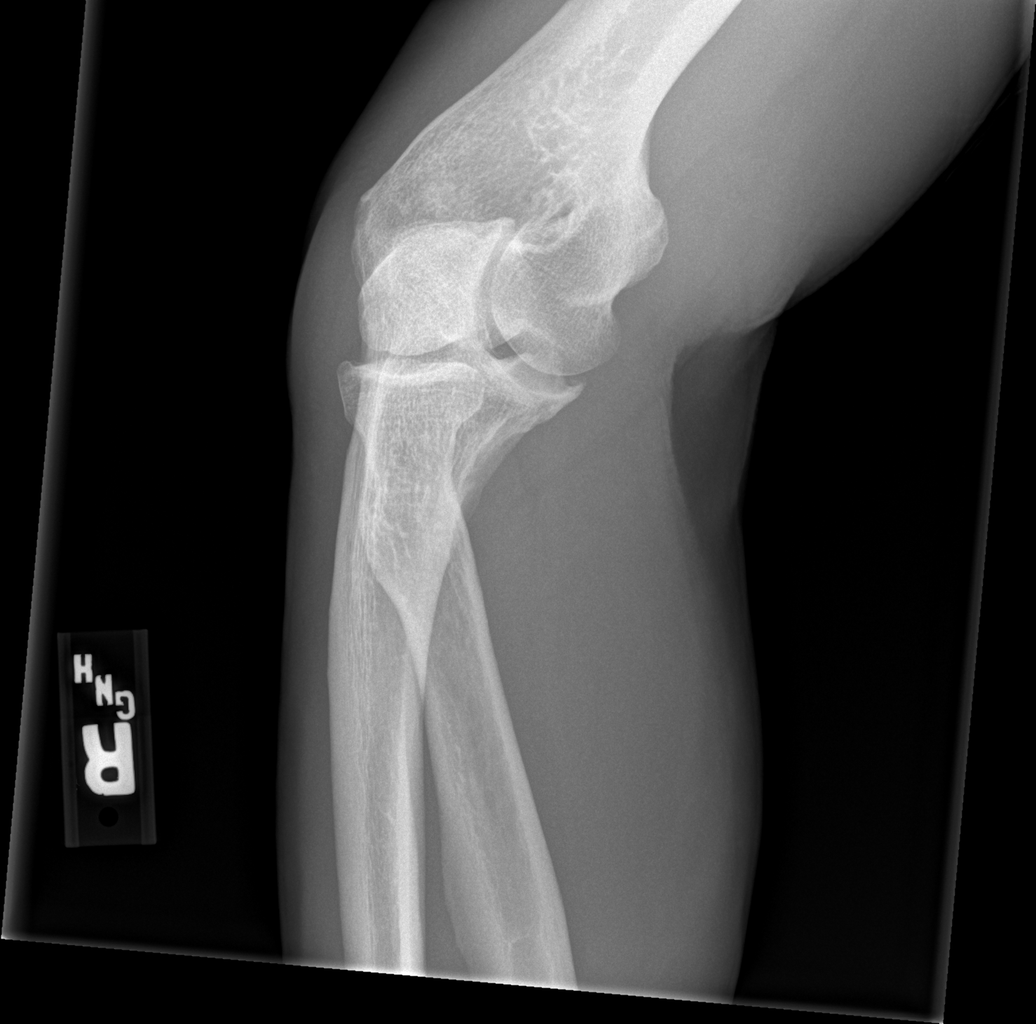

[x elbow obl right (2 of 2)]
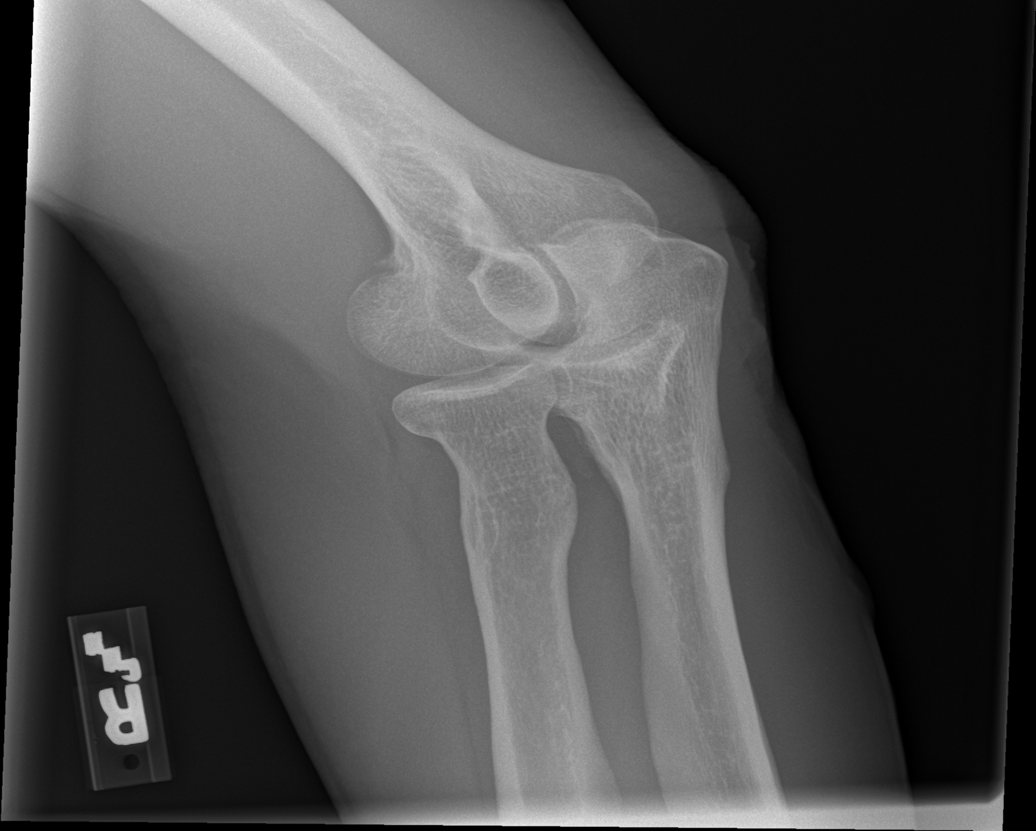

[5 of 5 positions shown; findings below may reference images not displayed]

FINDINGS: Soft tissue swelling about the right elbow. No joint effusion. No
fracture, subluxation or dislocation.
IMPRESSION: No acute bony abnormality.
